# Patient Record
Sex: Male | Born: 1945 | Race: White | Hispanic: No | Marital: Married | Smoking: Current every day smoker
Health system: Southern US, Community
[De-identification: ages and names within clinical notes are randomized; demographics above are authoritative.]

---

## 2014-01-19 ENCOUNTER — Ambulatory Visit (INDEPENDENT_AMBULATORY_CARE_PROVIDER_SITE_OTHER): Payer: Medicare Other

## 2014-01-19 ENCOUNTER — Ambulatory Visit (INDEPENDENT_AMBULATORY_CARE_PROVIDER_SITE_OTHER): Payer: Medicare Other | Admitting: Family Medicine

## 2014-01-19 VITALS — BP 138/60 | HR 61 | Temp 98.6°F | Resp 16 | Ht 68.0 in | Wt 164.0 lb

## 2014-01-19 DIAGNOSIS — M25579 Pain in unspecified ankle and joints of unspecified foot: Secondary | ICD-10-CM

## 2014-01-19 DIAGNOSIS — M25572 Pain in left ankle and joints of left foot: Secondary | ICD-10-CM

## 2014-01-19 DIAGNOSIS — T148XXA Other injury of unspecified body region, initial encounter: Secondary | ICD-10-CM

## 2014-01-19 NOTE — Patient Instructions (Signed)

## 2014-01-19 NOTE — Progress Notes (Signed)
      Chief Complaint:  Chief Complaint  Patient presents with  . Ankle Injury    left-fell and twisted it this morning    HPI: Thomas Brewer is a 68 y.o. male who is here for  ankel and foot pain and swelling after roleld his ankle on a step. He kept moving and he kept going and now is swollen. He has not been able to do RICE and does not take anythign for pain, just has pain with dorsiflexion.   No past medical history on file. No past surgical history on file. History   Social History  . Marital Status: Married    Spouse Name: N/A    Number of Children: N/A  . Years of Education: N/A   Social History Main Topics  . Smoking status: Current Every Day Smoker  . Smokeless tobacco: None  . Alcohol Use: Yes  . Drug Use: No  . Sexual Activity: None   Other Topics Concern  . None   Social History Narrative  . None   No family history on file. Allergies  Allergen Reactions  . Augmentin [Amoxicillin-Pot Clavulanate] Nausea And Vomiting    Amox is ok  . Codeine    Prior to Admission medications   Medication Sig Start Date End Date Taking? Authorizing Provider  LISINOPRIL-HYDROCHLOROTHIAZIDE PO Take by mouth.   Yes Historical Provider, MD     ROS: The patient denies fevers, chills, night sweats, unintentional weight loss, chest pain, palpitations, wheezing, dyspnea on exertion, nausea, vomiting, abdominal pain, dysuria, hematuria, melena, numbness, weakness, or tingling.   All other systems have been reviewed and were otherwise negative with the exception of those mentioned in the HPI and as above.    PHYSICAL EXAM: Filed Vitals:   01/19/14 1959  BP: 138/60  Pulse: 61  Temp: 98.6 F (37 C)  Resp: 16   Filed Vitals:   01/19/14 1959  Height: 5\' 8"  (1.727 m)  Weight: 164 lb (74.39 kg)   Body mass index is 24.94 kg/(m^2).  General: Alert, no acute distress HEENT:  Normocephalic, atraumatic, oropharynx patent. EOMI, PERRLA Cardiovascular:  Regular rate and  rhythm, no rubs murmurs or gallops.  No Carotid bruits, radial pulse intact. No pedal edema.  Respiratory: Clear to auscultation bilaterally.  No wheezes, rales, or rhonchi.  No cyanosis, no use of accessory musculature GI: No organomegaly, abdomen is soft and non-tender, positive bowel sounds.  No masses. Skin: No rashes. Neurologic: Facial musculature symmetric. Psychiatric: Patient is appropriate throughout our interaction. Lymphatic: No cervical lymphadenopathy Musculoskeletal: Gait antalgix + swelling, + DP, + cap refill Decrease ROM due to pain 5/5 str    LABS: No results found for this or any previous visit.   EKG/XRAY:   Primary read [facility]. No obvious fx or dislocation   ASSESSMENT/PLAN: Encounter Diagnoses  Name Primary?  . Pain in joint, ankle and foot, left Yes  . Sprain and strain    ACE WRAP, d/w pt different optiosn for foot ( brace, sweedo) but he wante ACE wrap RICE He has an oral surgery appt tomorrow so advise to take tylenol and not NSAID F/u prn  Gross sideeffects, risk and benefits, and alternatives of medications d/w patient. Patient is aware that all medications have potential sideeffects and we are unable to predict every sideeffect or drug-drug interaction that may occur.  Hamilton CapriLE,  PHUONG, DO 01/19/2014 9:23 PM

## 2014-01-20 ENCOUNTER — Telehealth: Payer: Self-pay | Admitting: Family Medicine

## 2014-01-20 NOTE — Telephone Encounter (Signed)
LM re official xray

## 2015-08-21 IMAGING — CR DG ANKLE COMPLETE 3+V*L*
2 series · 2 of 2 positions shown · non-contrast
Comparison: None.

CLINICAL DATA: 68-year-old male status post twisting injury with
pain. Initial encounter.

EXAM:
LEFT ANKLE COMPLETE - 3+ VIEW

[AP]
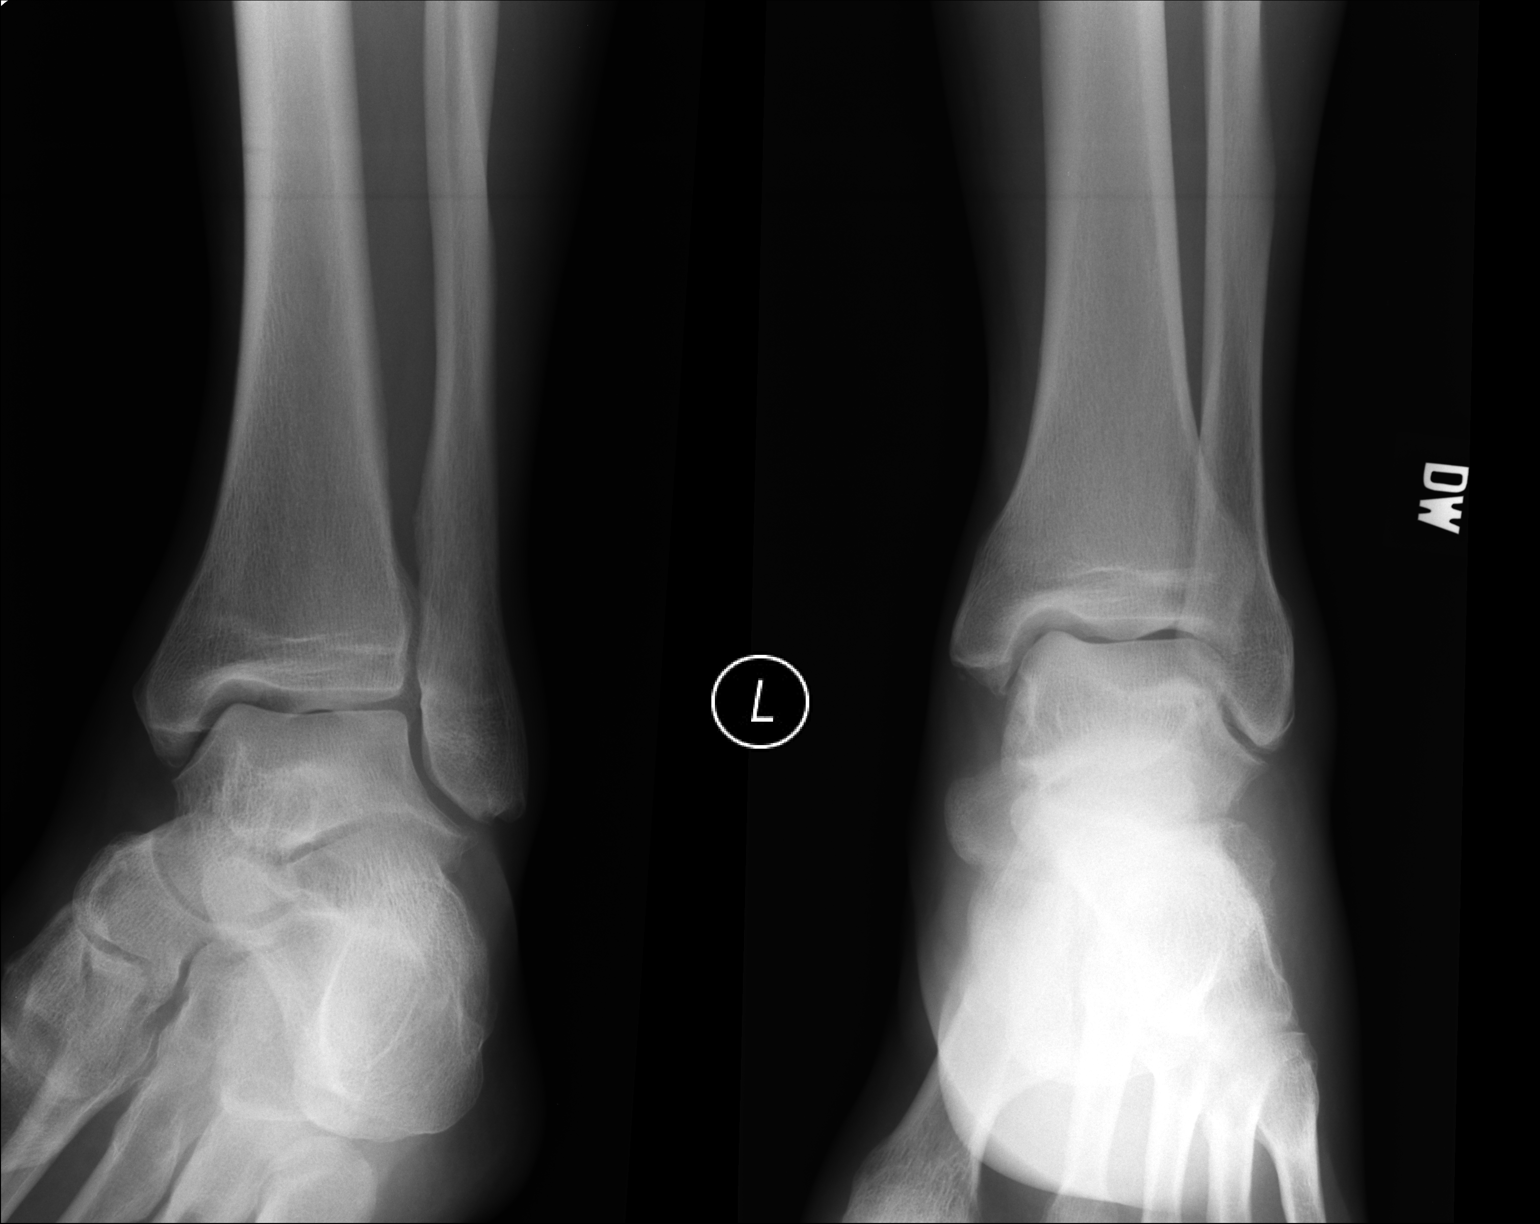

[ap obl int rot]
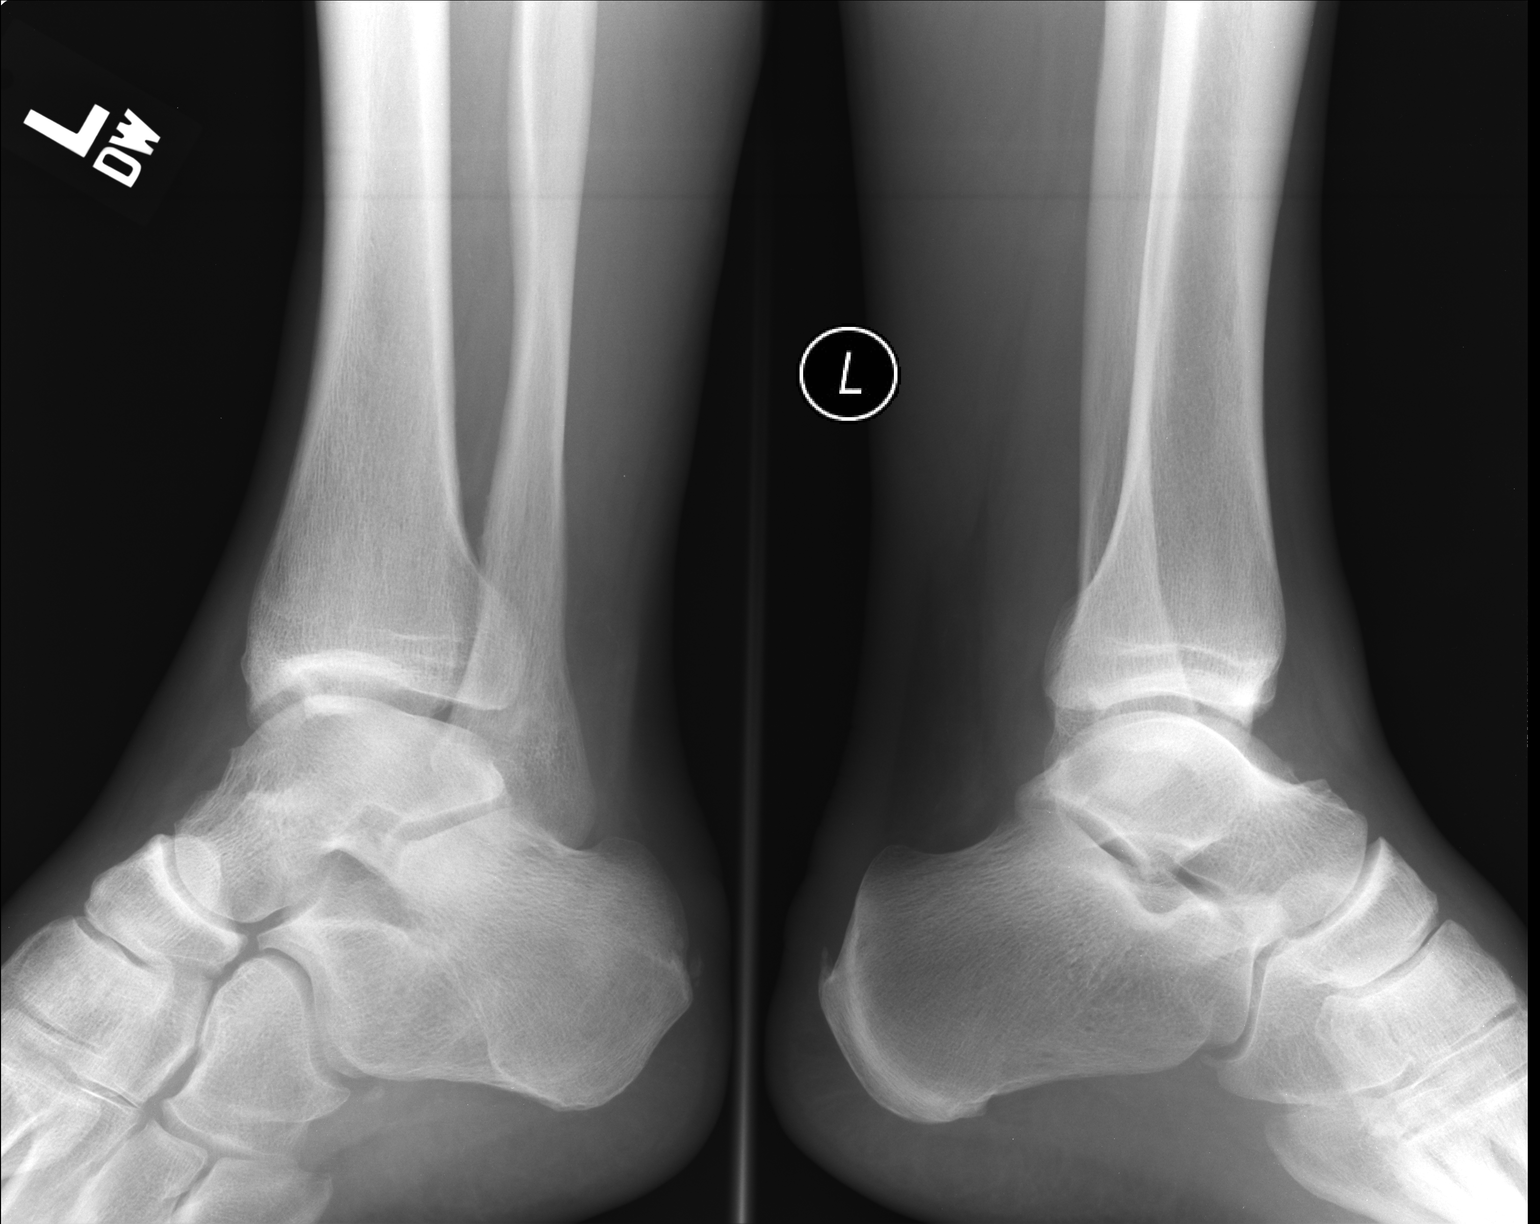

[2 of 2 positions shown; findings below may reference images not displayed]

FINDINGS: Bone mineralization is within normal limits. Calcaneus intact with
mild degenerative spurring. No definite joint effusion. Mortise
joint alignment preserved. Talar dome intact. Tiny ossific fragment
adjacent to the medial malleolus, appears chronic. No acute fracture
or dislocation identified.
IMPRESSION: No acute fracture or dislocation identified about the left ankle.
